# Patient Record
Sex: Female | Born: 1965 | Race: Black or African American | Hispanic: No | Marital: Single | Smoking: Never smoker
Health system: Southern US, Community
[De-identification: ages and names within clinical notes are randomized; demographics above are authoritative.]

## PROBLEM LIST (undated history)

## (undated) DIAGNOSIS — R569 Unspecified convulsions: Secondary | ICD-10-CM

## (undated) DIAGNOSIS — K219 Gastro-esophageal reflux disease without esophagitis: Secondary | ICD-10-CM

## (undated) DIAGNOSIS — M069 Rheumatoid arthritis, unspecified: Secondary | ICD-10-CM

## (undated) DIAGNOSIS — E785 Hyperlipidemia, unspecified: Secondary | ICD-10-CM

## (undated) DIAGNOSIS — F519 Sleep disorder not due to a substance or known physiological condition, unspecified: Secondary | ICD-10-CM

---

## 2021-03-29 ENCOUNTER — Other Ambulatory Visit: Payer: Self-pay | Admitting: Physician Assistant

## 2021-03-29 DIAGNOSIS — Z1231 Encounter for screening mammogram for malignant neoplasm of breast: Secondary | ICD-10-CM

## 2021-06-24 ENCOUNTER — Other Ambulatory Visit: Payer: Self-pay

## 2021-06-24 ENCOUNTER — Emergency Department (HOSPITAL_BASED_OUTPATIENT_CLINIC_OR_DEPARTMENT_OTHER): Payer: Medicaid Other

## 2021-06-24 ENCOUNTER — Encounter: Payer: Self-pay | Admitting: Emergency Medicine

## 2021-06-24 ENCOUNTER — Emergency Department (HOSPITAL_BASED_OUTPATIENT_CLINIC_OR_DEPARTMENT_OTHER)
Admission: EM | Admit: 2021-06-24 | Discharge: 2021-06-25 | Disposition: A | Discharge status: Home / Self Care | Payer: Medicaid Other | Attending: Emergency Medicine | Admitting: Emergency Medicine

## 2021-06-24 ENCOUNTER — Encounter (HOSPITAL_BASED_OUTPATIENT_CLINIC_OR_DEPARTMENT_OTHER): Payer: Self-pay | Admitting: Emergency Medicine

## 2021-06-24 DIAGNOSIS — M069 Rheumatoid arthritis, unspecified: Secondary | ICD-10-CM | POA: Insufficient documentation

## 2021-06-24 DIAGNOSIS — G479 Sleep disorder, unspecified: Secondary | ICD-10-CM | POA: Diagnosis present

## 2021-06-24 DIAGNOSIS — E785 Hyperlipidemia, unspecified: Secondary | ICD-10-CM | POA: Insufficient documentation

## 2021-06-24 DIAGNOSIS — R8279 Other abnormal findings on microbiological examination of urine: Secondary | ICD-10-CM | POA: Diagnosis not present

## 2021-06-24 DIAGNOSIS — F519 Sleep disorder not due to a substance or known physiological condition, unspecified: Secondary | ICD-10-CM | POA: Insufficient documentation

## 2021-06-24 DIAGNOSIS — R569 Unspecified convulsions: Secondary | ICD-10-CM | POA: Insufficient documentation

## 2021-06-24 DIAGNOSIS — R441 Visual hallucinations: Secondary | ICD-10-CM | POA: Diagnosis not present

## 2021-06-24 DIAGNOSIS — K219 Gastro-esophageal reflux disease without esophagitis: Secondary | ICD-10-CM | POA: Insufficient documentation

## 2021-06-24 HISTORY — DX: Sleep disorder not due to a substance or known physiological condition, unspecified: F51.9

## 2021-06-24 HISTORY — DX: Rheumatoid arthritis, unspecified: M06.9

## 2021-06-24 HISTORY — DX: Gastro-esophageal reflux disease without esophagitis: K21.9

## 2021-06-24 HISTORY — DX: Hyperlipidemia, unspecified: E78.5

## 2021-06-24 HISTORY — DX: Unspecified convulsions: R56.9

## 2021-06-24 LAB — URINALYSIS, ROUTINE W REFLEX MICROSCOPIC
Bilirubin Urine: NEGATIVE
Glucose, UA: NEGATIVE mg/dL
Hgb urine dipstick: NEGATIVE
Ketones, ur: NEGATIVE mg/dL
Leukocytes,Ua: NEGATIVE
Nitrite: NEGATIVE
Protein, ur: NEGATIVE mg/dL
Specific Gravity, Urine: 1.03 (ref 1.005–1.030)
pH: 6 (ref 5.0–8.0)

## 2021-06-24 LAB — CBC WITH DIFFERENTIAL/PLATELET
Abs Immature Granulocytes: 0.01 10*3/uL (ref 0.00–0.07)
Basophils Absolute: 0 10*3/uL (ref 0.0–0.1)
Basophils Relative: 1 %
Eosinophils Absolute: 0.2 10*3/uL (ref 0.0–0.5)
Eosinophils Relative: 3 %
HCT: 39 % (ref 36.0–46.0)
Hemoglobin: 13 g/dL (ref 12.0–15.0)
Immature Granulocytes: 0 %
Lymphocytes Relative: 42 %
Lymphs Abs: 2.8 10*3/uL (ref 0.7–4.0)
MCH: 28.3 pg (ref 26.0–34.0)
MCHC: 33.3 g/dL (ref 30.0–36.0)
MCV: 85 fL (ref 80.0–100.0)
Monocytes Absolute: 0.5 10*3/uL (ref 0.1–1.0)
Monocytes Relative: 7 %
Neutro Abs: 3.2 10*3/uL (ref 1.7–7.7)
Neutrophils Relative %: 47 %
Platelets: 221 10*3/uL (ref 150–400)
RBC: 4.59 MIL/uL (ref 3.87–5.11)
RDW: 14.5 % (ref 11.5–15.5)
WBC: 6.7 10*3/uL (ref 4.0–10.5)
nRBC: 0 % (ref 0.0–0.2)

## 2021-06-24 MED ORDER — SODIUM CHLORIDE 0.9 % IV BOLUS
500.0000 mL | Freq: Once | INTRAVENOUS | Status: AC
  Administered 2021-06-24: 500 mL via INTRAVENOUS

## 2021-06-24 NOTE — ED Provider Notes (Signed)
[facility] EMERGENCY DEPARTMENT Provider Note   CSN: [id number] Arrival date & time: 06/24/21  2240     History Chief Complaint  Patient presents with   Hallucinations    Andrea Roman is a 55 y.o. female.  The history is provided by the patient and medical records.  Andrea Roman is a 55 y.o. female who presents to the Emergency Department complaining of hallucinations. She presents the emergency department by EMS for evaluation of hallucinations. She has a history of rheumatoid arthritis in his bed and wheelchair-bound at baseline. She states that for the last two weeks she has been having visual hallucinations. These are occurring throughout the day and making her sleep disturbed. She states that she is seeing cats and dogs and a variety of things. No auditory hallucinations. She denies any fevers, nausea, chest pain, abdominal pain, difficulty breathing, dysuria, new wounds or sores. She does state that she has experienced decreased urinary output and only urinated once yesterday. She also states that she was started on new medication to help her sleep, but is unsure what the medication is. She lives at home with her daughter. Denies SI, HI.  Additional hx available from patient's daughter after patient's initial assessment.  Sleep medicine - hydroxizine 25 mg prescribed in may but just started taking them    Past Medical History:  Diagnosis Date   GERD (gastroesophageal reflux disease)    Hyperlipidemia    Psychophysiologic sleep disorder    Rheumatoid arthritis (HCC)    Seizures (HCC)     Patient Active Problem List   Diagnosis Date Noted   GERD (gastroesophageal reflux disease) 06/24/2021   Hyperlipidemia 06/24/2021   Psychophysiologic sleep disorder 06/24/2021   Rheumatoid arthritis (HCC) 06/24/2021   Seizures (HCC) 06/24/2021    History reviewed. No pertinent surgical history.   OB History   No obstetric history on file.     History reviewed. No  pertinent family history.  Social History   Tobacco Use   Smoking status: Never   Smokeless tobacco: Never  Substance Use Topics   Alcohol use: Never   Drug use: Never    Home Medications Prior to Admission medications   Medication Sig Start Date End Date Taking? Authorizing Provider  traZODone (DESYREL) 50 MG tablet Take 1 tablet (50 mg total) by mouth at bedtime. 06/25/21  Yes Tilden Fossa, MD  atorvastatin (LIPITOR) 40 MG tablet Take 40 mg by mouth daily. 06/17/21   [provider]  docusate sodium (COLACE) 100 MG capsule 1 cap(s)    [provider]  gabapentin (NEURONTIN) 100 MG capsule Take by mouth. 06/17/21   [provider]  hydrOXYzine (VISTARIL) 25 MG capsule Take 25 mg by mouth at bedtime. 03/16/21   [provider]  omeprazole (PRILOSEC) 20 MG capsule 1 cap(s)    [provider]  predniSONE (DELTASONE) 5 MG tablet 1 tab(s)    [provider]    Allergies    Patient has no allergy information on record.  Review of Systems   Review of Systems  All other systems reviewed and are negative.  Physical Exam Updated Vital Signs BP 131/84   Pulse 91   Temp 98.2 F (36.8 C) (Oral)   Resp 20   Wt 89.6 kg   SpO2 97%   Physical Exam Vitals and nursing note reviewed.  Constitutional:      Appearance: She is well-developed.  HENT:     Head: Normocephalic and atraumatic.  Cardiovascular:  Rate and Rhythm: Normal rate and regular rhythm.  Pulmonary:     Effort: Pulmonary effort is normal. No respiratory distress.  Abdominal:     Palpations: Abdomen is soft.     Tenderness: There is no abdominal tenderness. There is no guarding or rebound.  Musculoskeletal:        General: No tenderness.     Comments: Contractures to the hands bilaterally. There is scarring and atrophy to the left lower extremity. There is a right lower extremity a.k.a.  Skin:    General: Skin is warm and dry.  Neurological:     Mental Status:  She is alert and oriented to person, place, and time.  Psychiatric:        Behavior: Behavior normal.    ED Results / Procedures / Treatments   Labs (all labs ordered are listed, but only abnormal results are displayed) Labs Reviewed  COMPREHENSIVE METABOLIC PANEL - Abnormal; Notable for the following components:      Result Value   Potassium 3.1 (*)    Chloride 112 (*)    Glucose, Bld 136 (*)    Calcium 8.4 (*)    Albumin 3.3 (*)    All other components within normal limits  URINE CULTURE  CBC WITH DIFFERENTIAL/PLATELET  URINALYSIS, ROUTINE W REFLEX MICROSCOPIC    EKG None  Radiology DG Chest Port 1 View  Result Date: 06/24/2021 CLINICAL DATA:  Altered mental status EXAM: PORTABLE CHEST 1 VIEW COMPARISON:  03/04/2020 FINDINGS: Low lung volumes. No focal consolidation, pleural effusion or pneumothorax. Normal cardiac size. IMPRESSION: No active disease.  Low lung volume Electronically Signed   By: Jasmine Pang M.D.   On: 06/24/2021 23:54    Procedures Procedures   Medications Ordered in ED Medications  potassium chloride SA (KLOR-CON) CR tablet 40 mEq (has no administration in time range)  sodium chloride 0.9 % bolus 500 mL (0 mLs Intravenous Stopped 06/25/21 0056)    ED Course  I have reviewed the triage vital signs and the nursing notes.  Pertinent labs & imaging results that were available during my care of the patient were reviewed by me and considered in my medical decision making (see chart for details).    MDM Rules/Calculators/A&P                          patient here for evaluation of two weeks of visual hallucinations. She is having poor sleep. This difficult to ascertain if the poor sleep is secondary to the hallucinations or the hallucinations or secondary to the poor sleep. She is not having much relief with hydroxyzine. She is not acutely psychotic in the department. Will discontinue hydroxyzine and start trazodone. Discussed with patient and daughter  outpatient follow-up and return precautions.  Labs with mild hypokalemia. No evidence of UTI or acute infectious process.  Final Clinical Impression(s) / ED Diagnoses Final diagnoses:  Sleep disorder  Hallucination, visual    Rx / DC Orders ED Discharge Orders          Ordered    traZODone (DESYREL) 50 MG tablet  Daily at bedtime        06/25/21 0120             Tilden Fossa, MD 06/25/21 [phone]

## 2021-06-24 NOTE — ED Triage Notes (Signed)
Pt has been having difficulty sleeping x 2 weeks and has been having visual hallucinations x 2 weeks. Pt took approximately 1500mg  gabapentin to try and sleep. Pt has home health care and states pt with possible UTI.

## 2021-06-25 LAB — COMPREHENSIVE METABOLIC PANEL
ALT: 16 U/L (ref 0–44)
AST: 20 U/L (ref 15–41)
Albumin: 3.3 g/dL — ABNORMAL LOW (ref 3.5–5.0)
Alkaline Phosphatase: 97 U/L (ref 38–126)
Anion gap: 6 (ref 5–15)
BUN: 13 mg/dL (ref 6–20)
CO2: 22 mmol/L (ref 22–32)
Calcium: 8.4 mg/dL — ABNORMAL LOW (ref 8.9–10.3)
Chloride: 112 mmol/L — ABNORMAL HIGH (ref 98–111)
Creatinine, Ser: 1 mg/dL (ref 0.44–1.00)
GFR, Estimated: >60 mL/min (ref >60)
Glucose, Bld: 136 mg/dL — ABNORMAL HIGH (ref 70–99)
Potassium: 3.1 mmol/L — ABNORMAL LOW (ref 3.5–5.1)
Sodium: 140 mmol/L (ref 135–145)
Total Bilirubin: 0.5 mg/dL (ref 0.3–1.2)
Total Protein: 7 g/dL (ref 6.5–8.1)

## 2021-06-25 MED ORDER — TRAZODONE HCL 50 MG PO TABS: 50.0000 mg | ORAL_TABLET | Freq: Every day | ORAL | 0 refills | Status: AC

## 2021-06-25 MED ORDER — POTASSIUM CHLORIDE CRYS ER 20 MEQ PO TBCR
40.0000 meq | EXTENDED_RELEASE_TABLET | Freq: Once | ORAL | Status: AC
  Administered 2021-06-25: 40 meq via ORAL
  Filled 2021-06-25: qty 2

## 2021-06-25 NOTE — ED Notes (Signed)
[facility] here for transport home.  

## 2021-06-25 NOTE — Discharge Instructions (Addendum)
Stop taking hydroxizine.   You may start taking melatonin, available over the counter for sleep.

## 2021-06-25 NOTE — ED Notes (Signed)
Daughter is aware pt is leaving dept.

## 2021-06-26 LAB — URINE CULTURE: Culture: NO GROWTH

## 2022-01-04 ENCOUNTER — Encounter: Payer: Self-pay | Admitting: General Practice

## 2023-02-16 ENCOUNTER — Other Ambulatory Visit: Payer: Self-pay | Admitting: Physician Assistant

## 2023-02-16 DIAGNOSIS — Z0000 Encounter for general adult medical examination without abnormal findings: Secondary | ICD-10-CM

## 2023-05-13 IMAGING — DX DG CHEST 1V PORT
1 series · 1 of 1 positions shown · non-contrast
Comparison: 03/04/2020

CLINICAL DATA: Altered mental status

EXAM:
PORTABLE CHEST 1 VIEW

[chest ap]
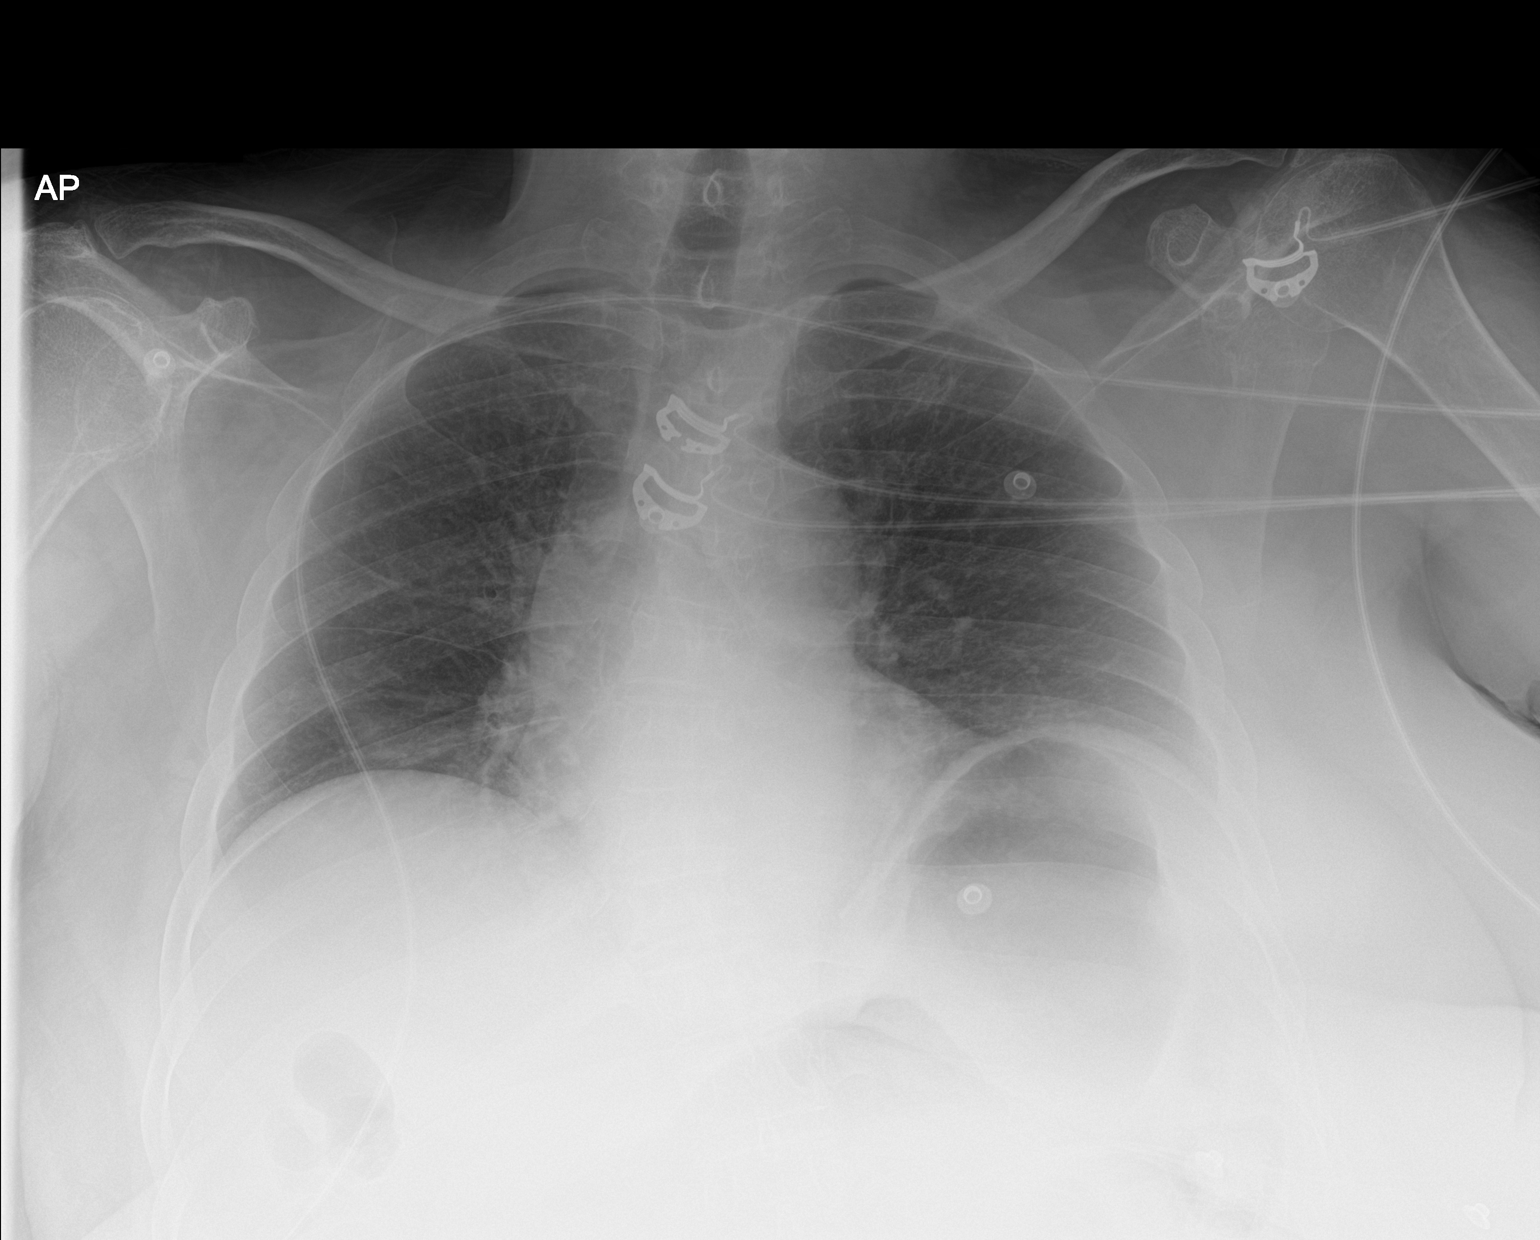

[1 of 1 positions shown; findings below may reference images not displayed]

FINDINGS: Low lung volumes. No focal consolidation, pleural effusion or
pneumothorax. Normal cardiac size.
IMPRESSION: No active disease.  Low lung volume

## 2023-05-16 ENCOUNTER — Ambulatory Visit (HOSPITAL_COMMUNITY): Payer: Medicaid Other | Admitting: Psychiatry

## 2023-06-08 ENCOUNTER — Ambulatory Visit (HOSPITAL_COMMUNITY): Payer: Medicaid Other | Admitting: Psychiatry

## 2023-10-20 ENCOUNTER — Inpatient Hospital Stay (HOSPITAL_BASED_OUTPATIENT_CLINIC_OR_DEPARTMENT_OTHER): Admission: RE | Admit: 2023-10-20 | Payer: Medicaid Other | Source: Ambulatory Visit | Admitting: Radiology

## 2023-10-26 ENCOUNTER — Ambulatory Visit (HOSPITAL_BASED_OUTPATIENT_CLINIC_OR_DEPARTMENT_OTHER): Payer: Medicaid Other | Admitting: Radiology

## 2024-03-18 ENCOUNTER — Other Ambulatory Visit (HOSPITAL_BASED_OUTPATIENT_CLINIC_OR_DEPARTMENT_OTHER): Payer: Self-pay | Admitting: Physician Assistant

## 2024-03-18 DIAGNOSIS — Z1231 Encounter for screening mammogram for malignant neoplasm of breast: Secondary | ICD-10-CM
